# Patient Record
Sex: Female | Born: 1960 | Race: White | Hispanic: No | Smoking: Current every day smoker
Health system: Southern US, Community
[De-identification: ages and names within clinical notes are randomized; demographics above are authoritative.]

## PROBLEM LIST (undated history)

## (undated) DIAGNOSIS — M5136 Other intervertebral disc degeneration, lumbar region: Secondary | ICD-10-CM

## (undated) HISTORY — PX: DILATION AND CURETTAGE OF UTERUS: SHX78

## (undated) HISTORY — PX: CHOLECYSTECTOMY: SHX55

## (undated) HISTORY — PX: MASTECTOMY PARTIAL / LUMPECTOMY: SUR851

## (undated) HISTORY — DX: Other intervertebral disc degeneration, lumbar region: M51.36

---

## 2001-03-13 ENCOUNTER — Ambulatory Visit (HOSPITAL_COMMUNITY): Admission: RE | Admit: 2001-03-13 | Discharge: 2001-03-13 | Payer: Self-pay | Admitting: Obstetrics and Gynecology

## 2001-03-13 ENCOUNTER — Encounter: Payer: Self-pay | Admitting: Obstetrics and Gynecology

## 2006-01-26 ENCOUNTER — Ambulatory Visit (HOSPITAL_COMMUNITY): Admission: RE | Admit: 2006-01-26 | Discharge: 2006-01-26 | Payer: Self-pay | Admitting: Obstetrics and Gynecology

## 2008-06-23 ENCOUNTER — Ambulatory Visit (HOSPITAL_COMMUNITY): Admission: RE | Admit: 2008-06-23 | Discharge: 2008-06-23 | Payer: Self-pay | Admitting: Family Medicine

## 2008-07-10 ENCOUNTER — Ambulatory Visit (HOSPITAL_COMMUNITY): Admission: RE | Admit: 2008-07-10 | Discharge: 2008-07-10 | Payer: Self-pay | Admitting: Family Medicine

## 2008-07-10 ENCOUNTER — Encounter (INDEPENDENT_AMBULATORY_CARE_PROVIDER_SITE_OTHER): Payer: Self-pay | Admitting: Radiology

## 2008-07-17 ENCOUNTER — Encounter: Admission: RE | Admit: 2008-07-17 | Discharge: 2008-07-17 | Payer: Self-pay | Admitting: Family Medicine

## 2008-07-20 ENCOUNTER — Encounter: Admission: RE | Admit: 2008-07-20 | Discharge: 2008-07-20 | Payer: Self-pay | Admitting: Family Medicine

## 2008-07-31 ENCOUNTER — Ambulatory Visit (HOSPITAL_COMMUNITY): Admission: RE | Admit: 2008-07-31 | Discharge: 2008-07-31 | Payer: Self-pay | Admitting: General Surgery

## 2008-07-31 ENCOUNTER — Encounter (INDEPENDENT_AMBULATORY_CARE_PROVIDER_SITE_OTHER): Payer: Self-pay | Admitting: General Surgery

## 2008-09-03 ENCOUNTER — Encounter (HOSPITAL_COMMUNITY): Admission: RE | Admit: 2008-09-03 | Discharge: 2008-10-03 | Payer: Self-pay | Admitting: Oncology

## 2008-09-03 ENCOUNTER — Ambulatory Visit (HOSPITAL_COMMUNITY): Payer: Self-pay | Admitting: Oncology

## 2008-09-10 ENCOUNTER — Ambulatory Visit (HOSPITAL_COMMUNITY): Admission: RE | Admit: 2008-09-10 | Discharge: 2008-09-10 | Payer: Self-pay | Admitting: Oncology

## 2008-10-15 ENCOUNTER — Ambulatory Visit: Admission: RE | Admit: 2008-10-15 | Discharge: 2009-01-13 | Payer: Self-pay | Admitting: Radiation Oncology

## 2008-12-17 ENCOUNTER — Ambulatory Visit (HOSPITAL_COMMUNITY): Payer: Self-pay | Admitting: Oncology

## 2009-03-20 ENCOUNTER — Ambulatory Visit (HOSPITAL_COMMUNITY): Payer: Self-pay | Admitting: Oncology

## 2009-06-03 ENCOUNTER — Ambulatory Visit (HOSPITAL_COMMUNITY): Payer: Self-pay | Admitting: Oncology

## 2009-07-15 ENCOUNTER — Ambulatory Visit (HOSPITAL_COMMUNITY): Admission: RE | Admit: 2009-07-15 | Discharge: 2009-07-15 | Payer: Self-pay | Admitting: Family Medicine

## 2009-08-02 ENCOUNTER — Encounter: Payer: Self-pay | Admitting: Cardiology

## 2009-08-07 ENCOUNTER — Ambulatory Visit: Payer: Self-pay | Admitting: Cardiology

## 2010-03-20 ENCOUNTER — Encounter: Payer: Self-pay | Admitting: Obstetrics and Gynecology

## 2010-03-21 ENCOUNTER — Encounter: Payer: Self-pay | Admitting: Family Medicine

## 2010-06-07 LAB — COMPREHENSIVE METABOLIC PANEL
Albumin: 3.5 g/dL (ref 3.5–5.2)
Alkaline Phosphatase: 57 U/L (ref 39–117)
BUN: 11 mg/dL (ref 6–23)
CO2: 27 mEq/L (ref 19–32)
Chloride: 107 mEq/L (ref 96–112)
Creatinine, Ser: 0.74 mg/dL (ref 0.4–1.2)
GFR calc non Af Amer: >60 mL/min (ref >60)
Glucose, Bld: 106 mg/dL — ABNORMAL HIGH (ref 70–99)
Total Bilirubin: 0.7 mg/dL (ref 0.3–1.2)

## 2010-06-07 LAB — CBC
HCT: 38.2 % (ref 36.0–46.0)
Hemoglobin: 13.6 g/dL (ref 12.0–15.0)
MCV: 91.7 fL (ref 78.0–100.0)
Platelets: 151 10*3/uL (ref 150–400)
WBC: 7.3 10*3/uL (ref 4.0–10.5)

## 2010-06-22 ENCOUNTER — Other Ambulatory Visit (HOSPITAL_BASED_OUTPATIENT_CLINIC_OR_DEPARTMENT_OTHER): Payer: Self-pay | Admitting: Family Medicine

## 2010-06-22 DIAGNOSIS — C50919 Malignant neoplasm of unspecified site of unspecified female breast: Secondary | ICD-10-CM

## 2010-06-22 DIAGNOSIS — Z139 Encounter for screening, unspecified: Secondary | ICD-10-CM

## 2010-06-22 DIAGNOSIS — Z09 Encounter for follow-up examination after completed treatment for conditions other than malignant neoplasm: Secondary | ICD-10-CM

## 2010-07-13 NOTE — H&P (Signed)
Linda Douglas, CRESSEY NO.:  [id number]   MEDICAL RECORD NO.:  [id number]          PATIENT TYPE:  AMB   LOCATION:  DAY                           FACILITY:  [facility]   PHYSICIAN:  Dalia Heading, M.D.  DATE OF BIRTH:  March 02, 1960   DATE OF ADMISSION:  DATE OF DISCHARGE:  LH                              HISTORY & PHYSICAL   CHIEF COMPLAINT:  Right breast carcinoma.   HISTORY OF PRESENT ILLNESS:  The patient is a 50 year old white female  who is referred for evaluation and treatment of right breast cancer.  This was found on routine mammography and biopsy proven to be invasive  mammary carcinoma.  It is not palpable.  MRI is negative for axillary  involvement or cancer in the left breast.  No nipple discharge or family  history of breast carcinoma is noted.   PAST MEDICAL HISTORY:  Unremarkable.   PAST SURGICAL HISTORY:  Cholecystectomy.   CURRENT MEDICATIONS:  Xanax.   ALLERGIES:  PENICILLIN and CODEINE.   REVIEW OF SYSTEMS:  The patient does smoke.  She denies any alcohol use.  The patient denies any other cardiopulmonary difficulties or bleeding  disorders.   PHYSICAL EXAMINATION:  GENERAL:  The patient is a obese white female in  no acute distress.  NECK:  Supple without lymphadenopathy.  LUNGS:  Clear to auscultation with equal breath sounds bilaterally.  HEART:  Regular rate and rhythm without S3, S4, murmurs.  BREAST:  Right breast examination reveals no dominant mass, nipple  discharge, or dimpling.  The axilla is negative for palpable nodes.  Left breast examination reveals no dominant mass, nipple discharge, or  dimpling.  The axilla is negative for palpable nodes.   Mammography and  MRI reports were reviewed.   IMPRESSION:  Right breast carcinoma.   PLAN:  The patient is scheduled for right partial mastectomy after  needle localization with sentinel lymph node biopsy and possible  axillary dissection on July 30, 2008.  The risks and benefits of  the  procedure including bleeding, infection, arm swelling, the possibility  of needing further surgery were fully explained to the patient, gave  informed consent.      Dalia Heading, M.D.  Electronically Signed     MAJ/MEDQ  D:  07/22/2008  T:  07/23/2008  Job:  [id number]   cc:   Angus G. Renard Matter, MD  Fax: [phone]

## 2010-07-13 NOTE — Op Note (Signed)
NAMERELLA, EGELSTON NO.:  [id number]   MEDICAL RECORD NO.:  [id number]          PATIENT TYPE:  AMB   LOCATION:  DAY                           FACILITY:  [facility]   PHYSICIAN:  Dalia Heading, M.D.  DATE OF BIRTH:  1961-02-24   DATE OF PROCEDURE:  07/31/2008  DATE OF DISCHARGE:  07/31/2008                               OPERATIVE REPORT   PREOPERATIVE DIAGNOSIS:  Right breast carcinoma, stage I.   POSTOPERATIVE DIAGNOSIS:  Right breast carcinoma, stage I.   PROCEDURE:  Right partial mastectomy after needle localization, sentinel  lymph node biopsy, injection of methylene blue.   SURGEON:  Dalia Heading, MD   ANESTHESIA:  General endotracheal.   INDICATIONS:  The patient is a 50 year old white female, who was found  on routine mammography to have an invasive mammary carcinoma of the  right breast.  Preoperative MRI confirmed this and there was no axillary  involvement or involvement of the left breast.  The patient now comes to  the operating for right partial mastectomy after needle localization,  sentinel lymph node biopsy, and possible axillary dissection.  The risks  and benefits of the procedures including bleeding, infection,  cardiopulmonary difficulties, and pain were fully explained to the  patient, gave informed consent.   PROCEDURE NOTE:  The patient was placed in the supine position.  After  induction of general endotracheal anesthesia, the right breast at the  needle localization site was instilled with 4 mL of diluted methylene  blue.  It was messaged for 5 minutes.  The right breast and axilla were  prepped and draped using the usual sterile technique with Betadine.   Surgical site confirmation was performed.  An incision was made in the  right axilla.  This was done after a Neoprobe was used to isolate the  involved sentinel nodes.  The dissection was taken down to the lymph  nodes.  The in vivo count was 37, 100 and ex vivo count was 2700.   This  lymph node was blue.  Additional lymph node was found.  It was also  blue.  Both were removed and sent to Pathology for frozen section.  Frozen section was negative for malignancy.  The basin count was less  than 10.  The wound was irrigated with normal saline.  A bleeding was  controlled using small clips.  The subcutaneous layer was reapproximated  using a 3-0 Vicryl interrupted suture.  The skin was closed using a 4-0  Vicryl subcuticular suture.  Dermabond was then applied.   The right partial mastectomy after needle localization was then  performed.  The needle was in the outer, lower quadrant of the right  breast.  Using the needle guidance as well as the mammograms, the  remaining biopsy clip as well as the needle and a normal amount of  breast tissue was removed circumferentially from the right breast  without difficulty.  The specimen was then sent to Radiology.  Specimen  radiography revealed the suspicious lesion, clipped, and the needle to  be in the appropriate position.  Abdomen was  packed up.  A short suture  was placed superiorly and a long suture placed laterally for orientation  purposes.  The specimen was then sent to Pathology for further  examination.  Any bleeding was controlled using Bovie electrocautery.  The subcutaneous layer was reapproximated using a 3-0 Vicryl interrupted  suture.  The skin was closed using a 4-0 Vicryl subcuticular suture.  Dermabond was then applied.   All tape and needle counts correct at the end of the procedures.  The  patient was extubated in the operating room, went back to recovery room  awake in stable condition.   COMPLICATIONS:  None.   SPECIMEN:  Right breast tissue, sentinel lymph nodes, right axilla x2.   BLOOD LOSS:  Minimal.      Dalia Heading, M.D.  Electronically Signed     MAJ/MEDQ  D:  07/31/2008  T:  08/01/2008  Job:  [id number]   cc:   Angus G. Renard Matter, MD  Fax: [phone]

## 2010-07-21 ENCOUNTER — Ambulatory Visit (HOSPITAL_COMMUNITY)
Admission: RE | Admit: 2010-07-21 | Discharge: 2010-07-21 | Disposition: A | Discharge status: Home / Self Care | Payer: BC Managed Care – PPO | Source: Ambulatory Visit | Attending: Family Medicine | Admitting: Family Medicine

## 2010-07-21 DIAGNOSIS — C50919 Malignant neoplasm of unspecified site of unspecified female breast: Secondary | ICD-10-CM

## 2010-07-21 DIAGNOSIS — Z853 Personal history of malignant neoplasm of breast: Secondary | ICD-10-CM | POA: Insufficient documentation

## 2010-07-21 DIAGNOSIS — Z09 Encounter for follow-up examination after completed treatment for conditions other than malignant neoplasm: Secondary | ICD-10-CM

## 2011-05-04 IMAGING — MG MM BREAST NEEDLE LOCALIZATION*R*
4 series · 6 of 6 positions shown · non-contrast
Comparison: none

CLINICAL DATA: Newly-diagnosed right lateral breast invasive
mammary carcinoma.  Preoperative needle localization was performed.

[Series 1: R CC · right · 0.07mm/px · 2 of 2 slices shown (1 of 3)]
[im 1/2]
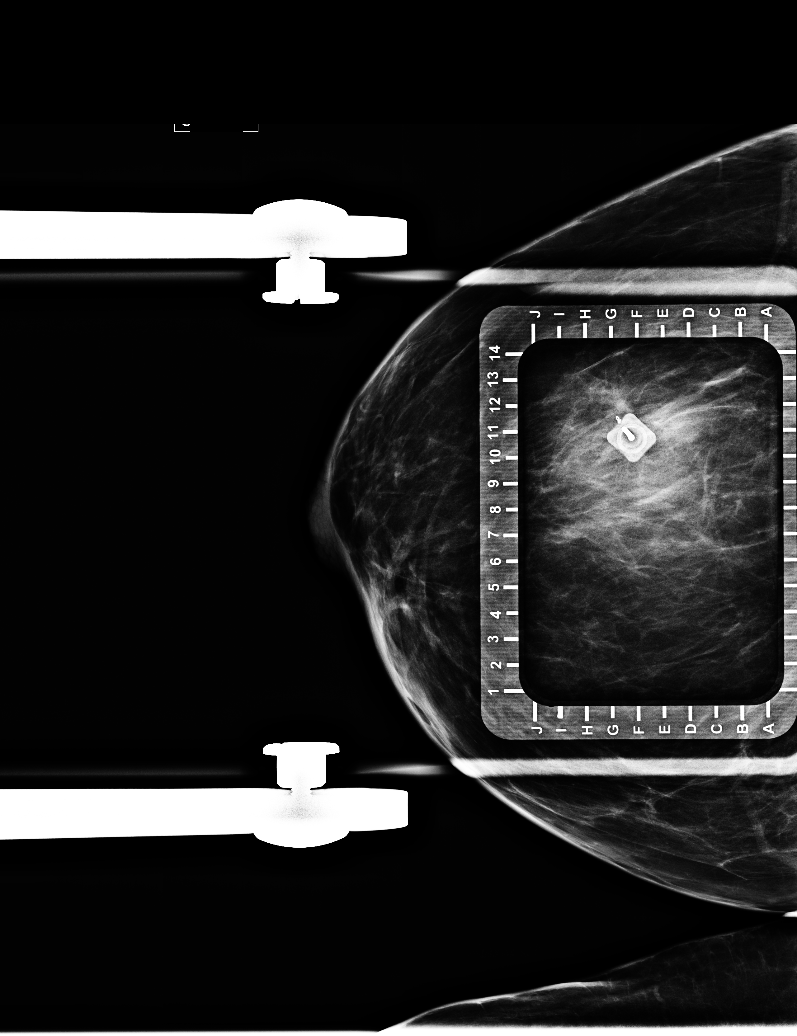
[im 2/2]
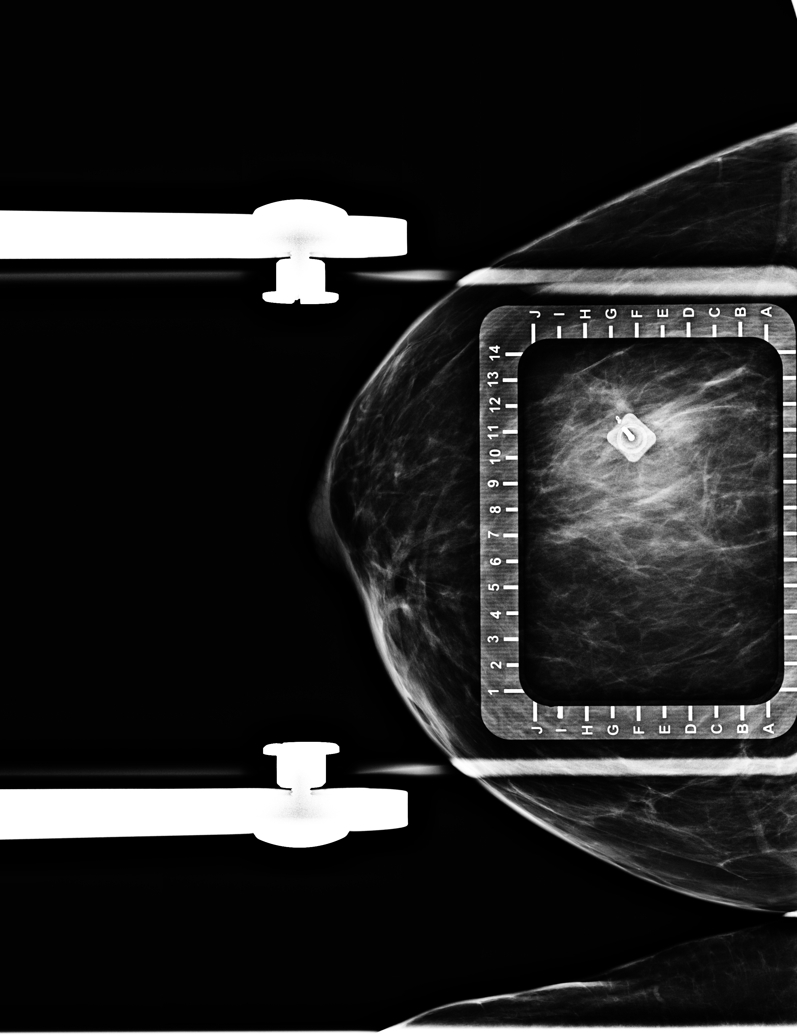

[Series 4: R LM · right · 0.07mm/px · 2 of 2 slices shown]
[im 1/2]
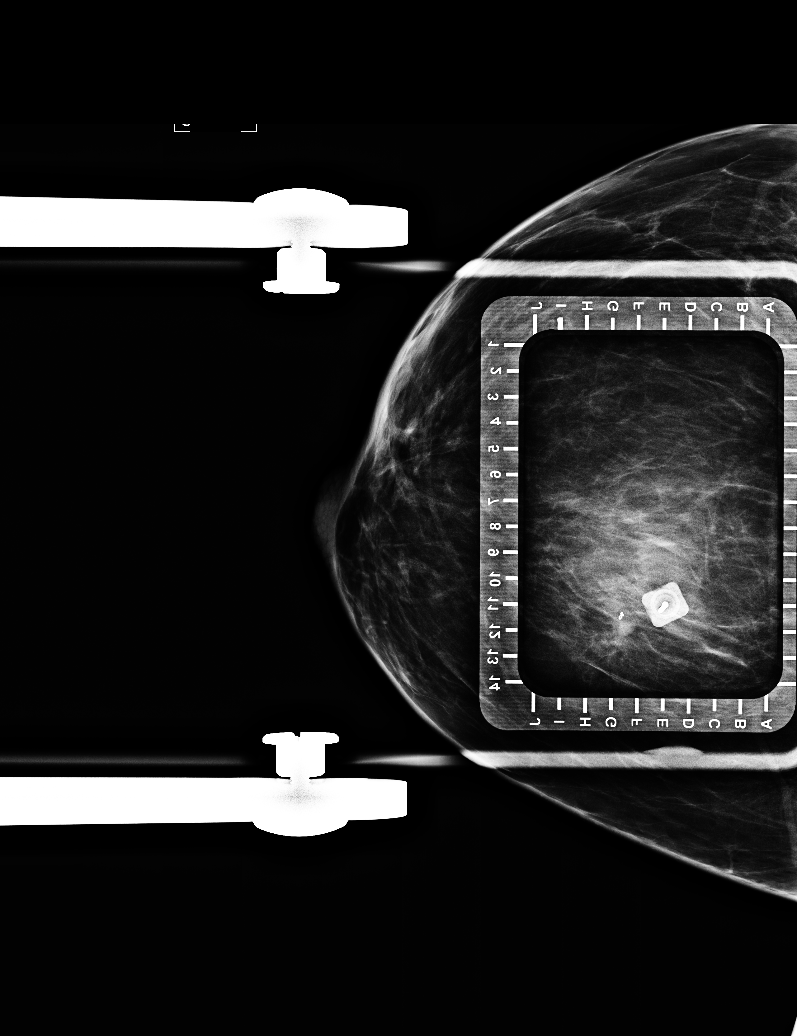
[im 2/2]
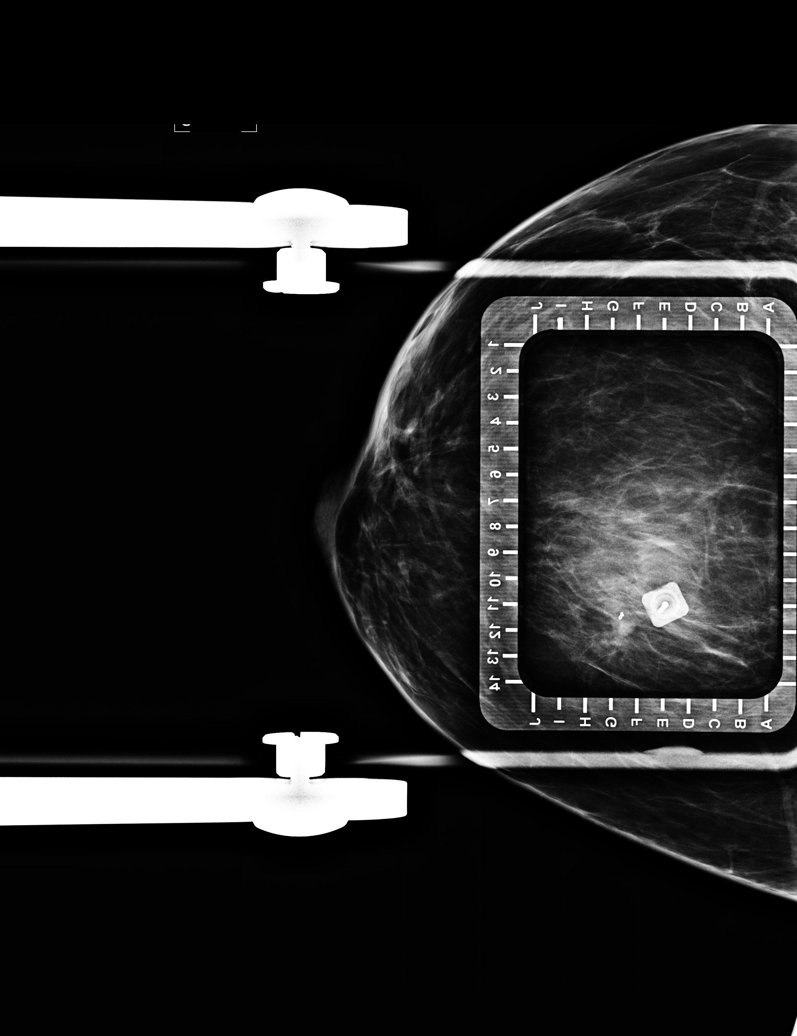

[R CC (2 of 3)]
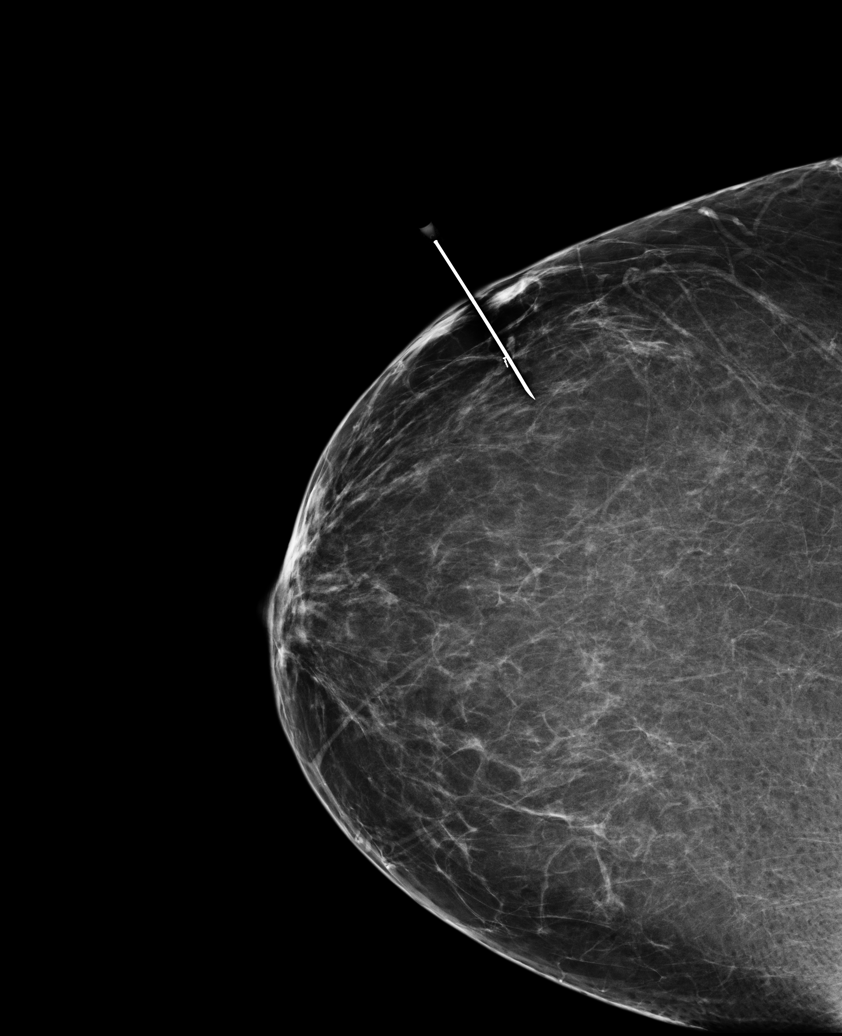

[R CC (3 of 3)]
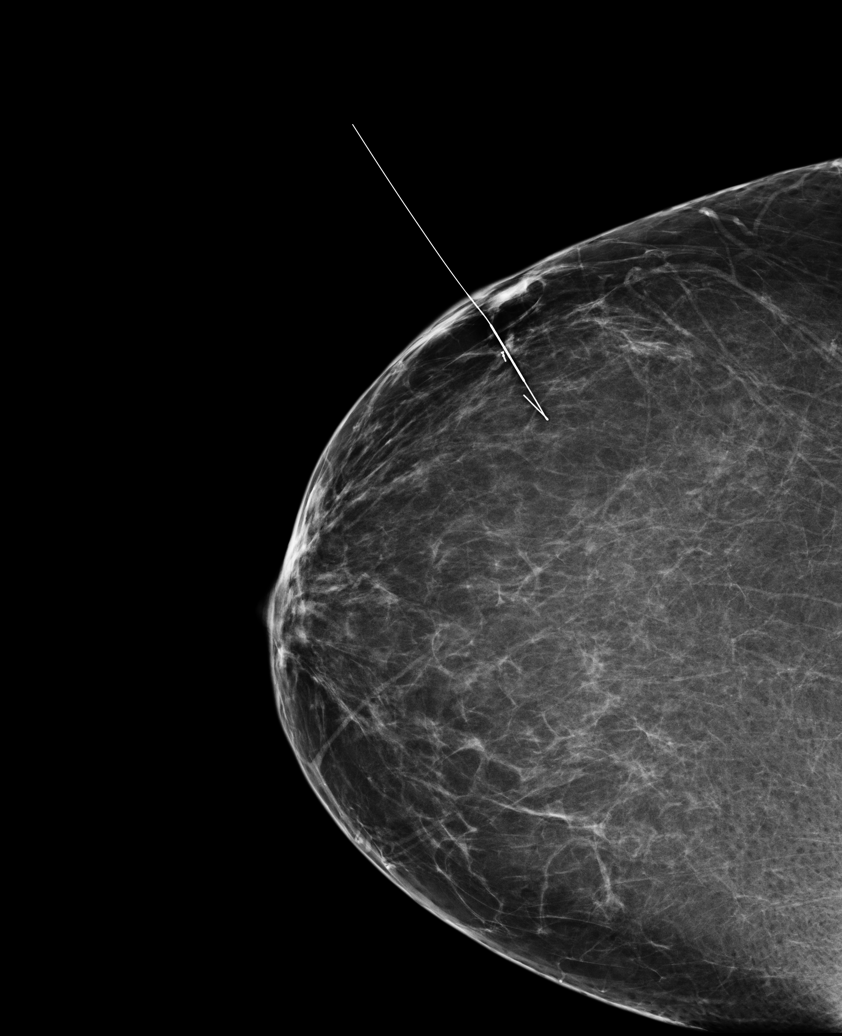

[6 of 6 positions shown; findings below may reference images not displayed]

NEEDLE LOCALIZATION WITH MAMMOGRAPHIC GUIDANCE AND SPECIMEN
RADIOGRAPH

Patient presents for needle localization prior to surgical excision
of biopsy-proven right lateral breast cancer.  I met with the
patient and we discussed the procedure of needle localization
including risks, benefits, and alternatives. Specifically, we
discussed the risks of infection, bleeding, tissue injury, and
inadequate sampling. Informed written consent was given.

Using mammographic guidance, sterile technique, 2% lidocaine and a
5 cm modified Kopans needle, the mass and clip were localized using
a lateral to medial approach.  The films are marked for Dr.
Apikan.

Specimen radiograph confirms the mass and clip are present in the
tissue sample. The specimen is marked for pathology.
IMPRESSION: Needle localization of clip and mass in the lateral right breast
and specimen radiography as above.  No apparent complications.

## 2011-08-17 ENCOUNTER — Other Ambulatory Visit (HOSPITAL_COMMUNITY): Payer: Self-pay | Admitting: Oncology

## 2011-08-17 DIAGNOSIS — Z09 Encounter for follow-up examination after completed treatment for conditions other than malignant neoplasm: Secondary | ICD-10-CM

## 2011-08-17 DIAGNOSIS — Z139 Encounter for screening, unspecified: Secondary | ICD-10-CM

## 2011-08-31 ENCOUNTER — Ambulatory Visit (HOSPITAL_COMMUNITY)
Admission: RE | Admit: 2011-08-31 | Discharge: 2011-08-31 | Disposition: A | Discharge status: Home / Self Care | Payer: BC Managed Care – PPO | Source: Ambulatory Visit | Attending: Oncology | Admitting: Oncology

## 2011-08-31 DIAGNOSIS — Z1231 Encounter for screening mammogram for malignant neoplasm of breast: Secondary | ICD-10-CM | POA: Insufficient documentation

## 2011-08-31 DIAGNOSIS — Z853 Personal history of malignant neoplasm of breast: Secondary | ICD-10-CM | POA: Insufficient documentation

## 2011-08-31 DIAGNOSIS — Z09 Encounter for follow-up examination after completed treatment for conditions other than malignant neoplasm: Secondary | ICD-10-CM

## 2012-03-25 ENCOUNTER — Encounter: Payer: Self-pay | Admitting: Cardiology

## 2013-01-07 ENCOUNTER — Other Ambulatory Visit (HOSPITAL_COMMUNITY): Payer: Self-pay | Admitting: Nurse Practitioner

## 2013-01-07 DIAGNOSIS — Z9889 Other specified postprocedural states: Secondary | ICD-10-CM

## 2013-01-23 ENCOUNTER — Encounter (HOSPITAL_COMMUNITY): Payer: BC Managed Care – PPO

## 2013-01-23 ENCOUNTER — Ambulatory Visit (HOSPITAL_COMMUNITY): Payer: BC Managed Care – PPO

## 2013-01-30 ENCOUNTER — Inpatient Hospital Stay (HOSPITAL_COMMUNITY): Admission: RE | Admit: 2013-01-30 | Payer: BC Managed Care – PPO | Source: Ambulatory Visit

## 2013-04-27 ENCOUNTER — Encounter: Payer: Self-pay | Admitting: Cardiology

## 2013-12-17 ENCOUNTER — Encounter: Payer: Self-pay | Admitting: Cardiology

## 2014-01-21 ENCOUNTER — Encounter: Payer: Self-pay | Admitting: *Deleted

## 2014-01-22 ENCOUNTER — Encounter: Payer: Self-pay | Admitting: Cardiology

## 2014-01-22 ENCOUNTER — Encounter: Payer: Self-pay | Admitting: *Deleted

## 2014-01-22 ENCOUNTER — Ambulatory Visit (INDEPENDENT_AMBULATORY_CARE_PROVIDER_SITE_OTHER): Payer: Worker's Compensation | Admitting: Cardiology

## 2014-01-22 DIAGNOSIS — R072 Precordial pain: Secondary | ICD-10-CM

## 2014-01-22 DIAGNOSIS — M549 Dorsalgia, unspecified: Secondary | ICD-10-CM

## 2014-01-22 DIAGNOSIS — G8929 Other chronic pain: Secondary | ICD-10-CM | POA: Insufficient documentation

## 2014-01-22 NOTE — Assessment & Plan Note (Signed)
Patient reports intermittent symptoms over the last few years, no escalating pattern. She has no clearly documented history of CAD, had an exercise echocardiogram done back in 2011 as detailed above. She is being considered for a Functional Capacity Evaluation through [facility] related to her chronic back pain. She states that she has actually already undergone some of this, however has not pursued any of the exercise testing. As noted above, we will plan a modified Bruce protocol GXT for more objective functional evaluation. She declines any type of pharmacologic stress test. Hopefully this information will be useful, even if she is not able to achieve adequate heart rate response.

## 2014-01-22 NOTE — Progress Notes (Signed)
Reason for visit: Cardiac evaluation  Clinical Summary Linda Douglas is a 53 y.o.female referred for cardiology consultation by Linda Douglas from [facility] on [address]. Situation is somewhat convoluted, records indicate that she was actually referred to see Linda Douglas for cardiology evaluation, however missed 3 visits and he refused to see her. My understanding is that she is being considered for a Functional Capacity Evaluation through [facility] which includes a variety of exercises including a submaximal treadmill evaluation and tasks requiring 55-85% age-adjusted maximum heart rate. She is being managed for chronic back pain reportedly in the setting of a work-related injury in June 2013 based on records from [facility].  I could not locate any clearly documented history of CAD. ECG done recently in October showed sinus rhythm with Q-wave in lead III, possibly normal variant, borderline low voltage in the precordial leads. Exercise echocardiogram done at [facility] back in June 2011 showed equivocal ST segment abnormalities in the setting of chest pain and poor exercise capacity, however no echocardiographic evidence of ischemia.  She tells me that she has had episodes of chest pain that happened intermittently when she "over does it" or sometimes when she "smokes too much." She is currently out of work, states that she has gained a significant amount of weight. Still does her chores around the house and other ADLs.  I discussed arranging a follow-up stress test with her today for objective cardiac evaluation, and also to get a good sense of her functional capacity since this might help gauge what she is able to do in rehabilitation/functional assessment. She tells me quite frankly that she is "scared of medications" and is not willing to undergo any type of pharmacologic stress test. I'm not certain how well she will do on a GXT in light of her reported back  and knee pain, however she is willing to give this a try.   Allergies  Allergen Reactions  . Codeine   . Penicillins     Current Outpatient Prescriptions  Medication Sig Dispense Refill  . ibuprofen (ADVIL,MOTRIN) 800 MG tablet Take 800 mg by mouth every 8 (eight) hours as needed.     No current facility-administered medications for this visit.    Past Medical History  Diagnosis Date  . Lumbar degenerative disc disease     Past Surgical History  Procedure Laterality Date  . Cholecystectomy    . Dilation and curettage of uterus    . Mastectomy partial / lumpectomy Right     Family History  Problem Relation Age of Onset  . Stroke Father   . Diabetes Mother   . Hypertension Mother   . Hypertension Brother   . Hypertension Sister   . Cerebrovascular Accident Father   . Congestive Heart Failure Maternal Grandmother   . Heart disease Maternal Grandmother   . Heart disease Maternal Grandfather     Social History Linda Douglas reports that she has been smoking Cigarettes.  She started smoking about 38 years ago. She has been smoking about 0.50 packs per day. She does not have any smokeless tobacco history on file. Linda Douglas reports that she does not drink alcohol.  Review of Systems Complete review of systems negative except as otherwise outlined in the clinical summary and also the following. No palpitations or syncope. No definite claudication. NYHA class II dyspnea.  Physical Examination Filed Vitals:   01/22/14 1025  BP: 132/83  Pulse: 71   Filed Weights   01/22/14 1025  Weight: 267 lb (121.11 kg)   Morbidly obese woman, appears comfortable at rest. HEENT: Conjunctiva and lids normal, oropharynx clear. Neck: Supple, increased girth, difficult to assess JVP, no carotid bruits, no thyromegaly. Lungs: Clear to auscultation, nonlabored breathing at rest. Cardiac: Regular rate and rhythm, no S3 or significant systolic murmur. Abdomen: Protuberant, bowel sounds  present, no guarding or rebound. Extremities: No pitting edema, distal pulses 2+. Skin: Warm and dry. Musculoskeletal: No kyphosis. Neuropsychiatric: Alert and oriented x3, affect grossly appropriate.   Problem List and Plan   Precordial pain Patient reports intermittent symptoms over the last few years, no escalating pattern. She has no clearly documented history of CAD, had an exercise echocardiogram done back in 2011 as detailed above. She is being considered for a Functional Capacity Evaluation through [facility] related to her chronic back pain. She states that she has actually already undergone some of this, however has not pursued any of the exercise testing. As noted above, we will plan a modified Bruce protocol GXT for more objective functional evaluation. She declines any type of pharmacologic stress test. Hopefully this information will be useful, even if she is not able to achieve adequate heart rate response.  Chronic back pain Followed by [facility], reportedly secondary to work-related injury in 2013.    Linda Douglas, M.D., F.A.C.C.

## 2014-01-22 NOTE — Assessment & Plan Note (Signed)
Followed by [facility], reportedly secondary to work-related injury in 2013.

## 2014-01-22 NOTE — Patient Instructions (Signed)
Your physician has requested that you have an exercise tolerance test. For further information please visit [email or web address]. Please also follow instruction sheet, as given. We will contact you with your results.

## 2014-02-03 ENCOUNTER — Ambulatory Visit (HOSPITAL_COMMUNITY)
Admission: RE | Admit: 2014-02-03 | Discharge: 2014-02-03 | Disposition: A | Discharge status: Home / Self Care | Payer: Worker's Compensation | Source: Ambulatory Visit | Attending: Cardiology | Admitting: Cardiology

## 2014-02-03 ENCOUNTER — Encounter (HOSPITAL_COMMUNITY): Payer: Self-pay

## 2014-02-03 DIAGNOSIS — M25561 Pain in right knee: Secondary | ICD-10-CM | POA: Insufficient documentation

## 2014-02-03 DIAGNOSIS — M549 Dorsalgia, unspecified: Secondary | ICD-10-CM | POA: Diagnosis not present

## 2014-02-03 DIAGNOSIS — Z0000 Encounter for general adult medical examination without abnormal findings: Secondary | ICD-10-CM | POA: Diagnosis not present

## 2014-02-03 DIAGNOSIS — Z538 Procedure and treatment not carried out for other reasons: Secondary | ICD-10-CM | POA: Diagnosis not present

## 2014-02-03 DIAGNOSIS — R079 Chest pain, unspecified: Secondary | ICD-10-CM

## 2014-02-03 DIAGNOSIS — M25551 Pain in right hip: Secondary | ICD-10-CM | POA: Diagnosis not present

## 2014-02-03 NOTE — Progress Notes (Addendum)
Stress Lab Nurses Notes - Linda Douglas  Linda Douglas 02/03/2014 Reason for doing test: Physical Type of test: Regular GTX Nurse performing test: Felicity Coyer, RN Nuclear Medicine Tech: Not Applicable Echo Tech: Not Applicable MD performing test: Dr. Lynann Beaver. Purcell Nails, NP Family MD: Dr. Everette Rank Test explained and consent signed: Yes.   IV started: No IV started Symptoms: right hip and right knee pain Treatment/Intervention: None Reason test stopped: right hip and right knee pain. After recovery IV was: No IV started Patient to return to [place]. Med at : Patient discharged: Home Patient's Condition upon discharge was: stable Comments: During test unable to obtain BP due to pt stopping prior to checking, HR 125.  In recovery BP 126/88 & HR 85.  Symptoms resolved during recovery.  [name]   Patient did not tolerate walking on the treadmill due to back pain during warm up period. She was not able to initiate Bruce protocol. Test aborted. Baseline EKG shows normal sinus rhythm. Unable to evaluate for ischemia or exercise induced ischemia. Consider pharmacologic testing if indicated.   Zandra Abts MD

## 2014-02-04 ENCOUNTER — Telehealth: Payer: Self-pay

## 2014-02-04 NOTE — Telephone Encounter (Signed)
  Message    GXT results reviewed. Test was aborted due to patient inability to ambulate on the treadmill adequately. No information was therefore able to be obtained regarding functional capacity or ischemic workup. Patient stated at her office visit that she was not willing to perform a pharmacologic stress test. Please let her know that this test did not provide any information that would allow [place] to further comment on her ability to tolerate rehabilitation through her orthopedic surgeon.      ----- Message -----    From: Arnoldo Lenis, MD    Sent: 02/03/2014  1:02 PM     To: Satira Sark, MD   Subject: Inpatient Notes                        Patient notified of results,still declines pharmacologic stress test,will forward to pcp

## 2014-02-27 ENCOUNTER — Ambulatory Visit (INDEPENDENT_AMBULATORY_CARE_PROVIDER_SITE_OTHER): Payer: BC Managed Care – PPO | Admitting: Obstetrics and Gynecology

## 2014-02-27 ENCOUNTER — Encounter: Payer: Self-pay | Admitting: Obstetrics and Gynecology

## 2014-02-27 ENCOUNTER — Other Ambulatory Visit: Payer: Self-pay | Admitting: Obstetrics and Gynecology

## 2014-02-27 ENCOUNTER — Other Ambulatory Visit (HOSPITAL_COMMUNITY)
Admission: RE | Admit: 2014-02-27 | Discharge: 2014-02-27 | Disposition: A | Discharge status: Home / Self Care | Payer: BC Managed Care – PPO | Source: Ambulatory Visit | Attending: Obstetrics and Gynecology | Admitting: Obstetrics and Gynecology

## 2014-02-27 VITALS — BP 112/78 | Ht 62.0 in | Wt 266.0 lb

## 2014-02-27 DIAGNOSIS — Z01411 Encounter for gynecological examination (general) (routine) with abnormal findings: Secondary | ICD-10-CM | POA: Diagnosis not present

## 2014-02-27 DIAGNOSIS — Z1151 Encounter for screening for human papillomavirus (HPV): Secondary | ICD-10-CM | POA: Insufficient documentation

## 2014-02-27 DIAGNOSIS — R87810 Cervical high risk human papillomavirus (HPV) DNA test positive: Secondary | ICD-10-CM | POA: Diagnosis present

## 2014-02-27 DIAGNOSIS — R87619 Unspecified abnormal cytological findings in specimens from cervix uteri: Secondary | ICD-10-CM

## 2014-02-27 NOTE — Progress Notes (Signed)
Patient ID: Linda Douglas, female   DOB: [birth year], 53 y.o.   MRN: [id number] Pt here today for abnormal pap smear done in October of 2014. Pt is unsure of what the results were she just knows they were abnormal.  Linda Douglas 53 y.o. No obstetric history on file. here for colposcopy for abnormal unknown severity pap smear on 11/2012.  Discussed role for HPV in cervical dysplasia, need for surveillance.  Patient given informed consent, signed copy in the chart, time out was performed.  Placed in lithotomy position. Cervix viewed with speculum and colposcope after application of acetic acid.   Colposcopy adequate? Yes  no abnormal vasculature; biopsies obtained at random at 3, 6. 9.   ECC specimen obtained.yes All specimens were labelled and sent to pathology.  Colposcopy IMPRESSION:noi visible dysplasia  Patient was given post procedure instructions. Will follow up pathology and manage accordingly.  Routine preventative health maintenance measures emphasized.

## 2014-03-04 LAB — CYTOLOGY - PAP

## 2014-03-06 ENCOUNTER — Ambulatory Visit (INDEPENDENT_AMBULATORY_CARE_PROVIDER_SITE_OTHER): Payer: BC Managed Care – PPO | Admitting: Obstetrics and Gynecology

## 2014-03-06 ENCOUNTER — Ambulatory Visit: Payer: BC Managed Care – PPO | Admitting: Obstetrics and Gynecology

## 2014-03-06 ENCOUNTER — Encounter: Payer: Self-pay | Admitting: Obstetrics and Gynecology

## 2014-03-06 VITALS — BP 120/80 | Ht 62.0 in | Wt 265.0 lb

## 2014-03-06 DIAGNOSIS — R87610 Atypical squamous cells of undetermined significance on cytologic smear of cervix (ASC-US): Secondary | ICD-10-CM

## 2014-03-06 NOTE — Progress Notes (Signed)
Patient ID: Linda Douglas, female   DOB: Jun 15, 1960, 54 y.o.   MRN: [id number] Pt here today for results of biopsy and pap smear.   [facility] Visit  Patient name: Linda Douglas MRN [id number]  Date of birth: 11/22/1960  CC & HPI:  Linda Douglas is a 54 y.o. female presenting today for results Cx bx and ecc.   ROS:  Pt amenorrheic x 10+ yr.  Pertinent History Reviewed:   Reviewed: Significant for cigarette smoking reduced by grandchild living in house. Medical         Past Medical History  Diagnosis Date  . Lumbar degenerative disc disease                               Surgical Hx:    Past Surgical History  Procedure Laterality Date  . Cholecystectomy    . Dilation and curettage of uterus    . Mastectomy partial / lumpectomy Right    Medications: Reviewed & Updated - see associated section                      Current outpatient prescriptions: ibuprofen (ADVIL,MOTRIN) 800 MG tablet, Take 800 mg by mouth every 8 (eight) hours as needed., Disp: , Rfl:    Social History: Reviewed -  reports that she has been smoking Cigarettes.  She started smoking about 38 years ago. She has been smoking about 0.50 packs per day. She has never used smokeless tobacco.  Objective Findings:  Vitals: Blood pressure 120/80, height 5\' 2"  (1.575 m), weight 265 lb (120.203 kg).  Physical Examination: General appearance - alert, well appearing, and in no distress, oriented to person, place, and time and normal appearing weight Mental status - alert, oriented to person, place, and time   Assessment & Plan:   A:  1. Koilocytic atypia, negative ECC,  P:  1. Pap in 1 yr.

## 2016-04-19 ENCOUNTER — Other Ambulatory Visit (HOSPITAL_COMMUNITY): Payer: Self-pay | Admitting: *Deleted

## 2016-04-19 DIAGNOSIS — Z9889 Other specified postprocedural states: Secondary | ICD-10-CM

## 2016-05-10 ENCOUNTER — Encounter (HOSPITAL_COMMUNITY): Payer: Self-pay

## 2016-05-23 ENCOUNTER — Other Ambulatory Visit: Payer: Self-pay | Admitting: Family Medicine

## 2016-05-24 ENCOUNTER — Ambulatory Visit (HOSPITAL_COMMUNITY)
Admission: RE | Admit: 2016-05-24 | Discharge: 2016-05-24 | Disposition: A | Discharge status: Home / Self Care | Payer: PRIVATE HEALTH INSURANCE | Source: Ambulatory Visit | Attending: *Deleted | Admitting: *Deleted

## 2016-05-24 DIAGNOSIS — Z853 Personal history of malignant neoplasm of breast: Secondary | ICD-10-CM | POA: Diagnosis not present

## 2016-05-24 DIAGNOSIS — Z9889 Other specified postprocedural states: Secondary | ICD-10-CM

## 2019-12-18 ENCOUNTER — Other Ambulatory Visit (HOSPITAL_COMMUNITY): Payer: Self-pay | Admitting: General Practice

## 2019-12-18 DIAGNOSIS — Z1231 Encounter for screening mammogram for malignant neoplasm of breast: Secondary | ICD-10-CM

## 2020-01-31 ENCOUNTER — Ambulatory Visit (HOSPITAL_COMMUNITY): Payer: PRIVATE HEALTH INSURANCE

## 2020-02-05 ENCOUNTER — Ambulatory Visit (HOSPITAL_COMMUNITY): Payer: Self-pay

## 2020-04-03 ENCOUNTER — Other Ambulatory Visit (HOSPITAL_COMMUNITY): Payer: Self-pay | Admitting: Sports Medicine

## 2020-04-03 DIAGNOSIS — Z1239 Encounter for other screening for malignant neoplasm of breast: Secondary | ICD-10-CM

## 2020-04-13 ENCOUNTER — Ambulatory Visit (HOSPITAL_COMMUNITY)
Admission: RE | Admit: 2020-04-13 | Discharge: 2020-04-13 | Disposition: A | Discharge status: Home / Self Care | Payer: Self-pay | Source: Ambulatory Visit | Attending: Sports Medicine | Admitting: Sports Medicine

## 2020-04-13 ENCOUNTER — Other Ambulatory Visit: Payer: Self-pay

## 2020-04-13 ENCOUNTER — Encounter (HOSPITAL_COMMUNITY): Payer: Self-pay

## 2020-04-13 DIAGNOSIS — Z1239 Encounter for other screening for malignant neoplasm of breast: Secondary | ICD-10-CM

## 2020-04-13 DIAGNOSIS — Z1231 Encounter for screening mammogram for malignant neoplasm of breast: Secondary | ICD-10-CM | POA: Insufficient documentation

## 2022-03-22 DIAGNOSIS — S90852A Superficial foreign body, left foot, initial encounter: Secondary | ICD-10-CM | POA: Diagnosis not present

## 2022-03-29 DIAGNOSIS — N938 Other specified abnormal uterine and vaginal bleeding: Secondary | ICD-10-CM | POA: Diagnosis not present

## 2022-03-29 DIAGNOSIS — Z1231 Encounter for screening mammogram for malignant neoplasm of breast: Secondary | ICD-10-CM | POA: Diagnosis not present

## 2022-03-30 ENCOUNTER — Other Ambulatory Visit (HOSPITAL_COMMUNITY): Payer: Self-pay | Admitting: Family Medicine

## 2022-03-30 DIAGNOSIS — Z1231 Encounter for screening mammogram for malignant neoplasm of breast: Secondary | ICD-10-CM

## 2022-04-21 DIAGNOSIS — N938 Other specified abnormal uterine and vaginal bleeding: Secondary | ICD-10-CM | POA: Diagnosis not present

## 2022-04-21 DIAGNOSIS — Z885 Allergy status to narcotic agent status: Secondary | ICD-10-CM | POA: Diagnosis not present

## 2022-04-21 DIAGNOSIS — E119 Type 2 diabetes mellitus without complications: Secondary | ICD-10-CM | POA: Diagnosis not present

## 2022-04-21 DIAGNOSIS — Z880 Allergy status to penicillin: Secondary | ICD-10-CM | POA: Diagnosis not present

## 2022-05-02 DIAGNOSIS — N950 Postmenopausal bleeding: Secondary | ICD-10-CM | POA: Diagnosis not present

## 2022-05-02 DIAGNOSIS — R9389 Abnormal findings on diagnostic imaging of other specified body structures: Secondary | ICD-10-CM | POA: Diagnosis not present

## 2022-05-02 DIAGNOSIS — N938 Other specified abnormal uterine and vaginal bleeding: Secondary | ICD-10-CM | POA: Diagnosis not present

## 2022-05-03 DIAGNOSIS — N950 Postmenopausal bleeding: Secondary | ICD-10-CM | POA: Diagnosis not present

## 2022-05-05 DIAGNOSIS — D239 Other benign neoplasm of skin, unspecified: Secondary | ICD-10-CM | POA: Diagnosis not present

## 2022-05-05 DIAGNOSIS — L723 Sebaceous cyst: Secondary | ICD-10-CM | POA: Diagnosis not present

## 2022-05-17 DIAGNOSIS — H5213 Myopia, bilateral: Secondary | ICD-10-CM | POA: Diagnosis not present

## 2022-05-17 DIAGNOSIS — H52223 Regular astigmatism, bilateral: Secondary | ICD-10-CM | POA: Diagnosis not present

## 2022-05-17 DIAGNOSIS — H524 Presbyopia: Secondary | ICD-10-CM | POA: Diagnosis not present

## 2022-06-09 DIAGNOSIS — L723 Sebaceous cyst: Secondary | ICD-10-CM | POA: Diagnosis not present

## 2022-06-15 DIAGNOSIS — G43909 Migraine, unspecified, not intractable, without status migrainosus: Secondary | ICD-10-CM | POA: Diagnosis not present

## 2022-06-15 DIAGNOSIS — R030 Elevated blood-pressure reading, without diagnosis of hypertension: Secondary | ICD-10-CM | POA: Diagnosis not present

## 2022-06-15 DIAGNOSIS — R69 Illness, unspecified: Secondary | ICD-10-CM | POA: Diagnosis not present

## 2022-06-15 DIAGNOSIS — Z6841 Body Mass Index (BMI) 40.0 and over, adult: Secondary | ICD-10-CM | POA: Diagnosis not present

## 2022-06-15 DIAGNOSIS — Z853 Personal history of malignant neoplasm of breast: Secondary | ICD-10-CM | POA: Diagnosis not present

## 2022-06-20 DIAGNOSIS — Z885 Allergy status to narcotic agent status: Secondary | ICD-10-CM | POA: Diagnosis not present

## 2022-06-20 DIAGNOSIS — Z8249 Family history of ischemic heart disease and other diseases of the circulatory system: Secondary | ICD-10-CM | POA: Diagnosis not present

## 2022-06-20 DIAGNOSIS — Z823 Family history of stroke: Secondary | ICD-10-CM | POA: Diagnosis not present

## 2022-06-20 DIAGNOSIS — Z6841 Body Mass Index (BMI) 40.0 and over, adult: Secondary | ICD-10-CM | POA: Diagnosis not present

## 2022-06-20 DIAGNOSIS — Z888 Allergy status to other drugs, medicaments and biological substances status: Secondary | ICD-10-CM | POA: Diagnosis not present

## 2022-06-20 DIAGNOSIS — Z853 Personal history of malignant neoplasm of breast: Secondary | ICD-10-CM | POA: Diagnosis not present

## 2022-06-20 DIAGNOSIS — Z87891 Personal history of nicotine dependence: Secondary | ICD-10-CM | POA: Diagnosis not present

## 2022-06-20 DIAGNOSIS — Z809 Family history of malignant neoplasm, unspecified: Secondary | ICD-10-CM | POA: Diagnosis not present

## 2022-06-20 DIAGNOSIS — Z008 Encounter for other general examination: Secondary | ICD-10-CM | POA: Diagnosis not present

## 2022-06-20 DIAGNOSIS — I10 Essential (primary) hypertension: Secondary | ICD-10-CM | POA: Diagnosis not present

## 2022-06-20 DIAGNOSIS — Z825 Family history of asthma and other chronic lower respiratory diseases: Secondary | ICD-10-CM | POA: Diagnosis not present

## 2022-06-20 DIAGNOSIS — Z791 Long term (current) use of non-steroidal anti-inflammatories (NSAID): Secondary | ICD-10-CM | POA: Diagnosis not present

## 2022-07-13 DIAGNOSIS — N644 Mastodynia: Secondary | ICD-10-CM | POA: Diagnosis not present

## 2022-07-13 DIAGNOSIS — Z853 Personal history of malignant neoplasm of breast: Secondary | ICD-10-CM | POA: Diagnosis not present

## 2022-09-12 DIAGNOSIS — J3489 Other specified disorders of nose and nasal sinuses: Secondary | ICD-10-CM | POA: Diagnosis not present

## 2022-09-12 DIAGNOSIS — K047 Periapical abscess without sinus: Secondary | ICD-10-CM | POA: Diagnosis not present

## 2022-09-12 DIAGNOSIS — R030 Elevated blood-pressure reading, without diagnosis of hypertension: Secondary | ICD-10-CM | POA: Diagnosis not present

## 2022-09-12 DIAGNOSIS — Z6841 Body Mass Index (BMI) 40.0 and over, adult: Secondary | ICD-10-CM | POA: Diagnosis not present

## 2022-09-20 DIAGNOSIS — E039 Hypothyroidism, unspecified: Secondary | ICD-10-CM | POA: Diagnosis not present

## 2022-09-20 DIAGNOSIS — Z0000 Encounter for general adult medical examination without abnormal findings: Secondary | ICD-10-CM | POA: Diagnosis not present

## 2022-09-20 DIAGNOSIS — Z13220 Encounter for screening for lipoid disorders: Secondary | ICD-10-CM | POA: Diagnosis not present

## 2022-09-20 DIAGNOSIS — E7849 Other hyperlipidemia: Secondary | ICD-10-CM | POA: Diagnosis not present

## 2022-09-20 DIAGNOSIS — Z1329 Encounter for screening for other suspected endocrine disorder: Secondary | ICD-10-CM | POA: Diagnosis not present

## 2022-09-21 DIAGNOSIS — R7309 Other abnormal glucose: Secondary | ICD-10-CM | POA: Diagnosis not present

## 2022-09-23 DIAGNOSIS — Z853 Personal history of malignant neoplasm of breast: Secondary | ICD-10-CM | POA: Diagnosis not present

## 2022-09-23 DIAGNOSIS — Z0001 Encounter for general adult medical examination with abnormal findings: Secondary | ICD-10-CM | POA: Diagnosis not present

## 2022-09-23 DIAGNOSIS — R030 Elevated blood-pressure reading, without diagnosis of hypertension: Secondary | ICD-10-CM | POA: Diagnosis not present

## 2022-09-23 DIAGNOSIS — F411 Generalized anxiety disorder: Secondary | ICD-10-CM | POA: Diagnosis not present

## 2022-09-23 DIAGNOSIS — Z6841 Body Mass Index (BMI) 40.0 and over, adult: Secondary | ICD-10-CM | POA: Diagnosis not present

## 2022-09-23 DIAGNOSIS — R739 Hyperglycemia, unspecified: Secondary | ICD-10-CM | POA: Diagnosis not present

## 2022-10-11 DIAGNOSIS — L304 Erythema intertrigo: Secondary | ICD-10-CM | POA: Diagnosis not present

## 2022-10-11 DIAGNOSIS — R1013 Epigastric pain: Secondary | ICD-10-CM | POA: Diagnosis not present

## 2022-12-13 DIAGNOSIS — M810 Age-related osteoporosis without current pathological fracture: Secondary | ICD-10-CM | POA: Diagnosis not present

## 2023-05-23 DIAGNOSIS — Z880 Allergy status to penicillin: Secondary | ICD-10-CM | POA: Diagnosis not present

## 2023-05-23 DIAGNOSIS — M792 Neuralgia and neuritis, unspecified: Secondary | ICD-10-CM | POA: Diagnosis not present

## 2023-05-23 DIAGNOSIS — Z87891 Personal history of nicotine dependence: Secondary | ICD-10-CM | POA: Diagnosis not present

## 2023-05-23 DIAGNOSIS — M1990 Unspecified osteoarthritis, unspecified site: Secondary | ICD-10-CM | POA: Diagnosis not present

## 2023-05-23 DIAGNOSIS — Z833 Family history of diabetes mellitus: Secondary | ICD-10-CM | POA: Diagnosis not present

## 2023-05-23 DIAGNOSIS — F32A Depression, unspecified: Secondary | ICD-10-CM | POA: Diagnosis not present

## 2023-05-23 DIAGNOSIS — Z823 Family history of stroke: Secondary | ICD-10-CM | POA: Diagnosis not present

## 2023-05-23 DIAGNOSIS — M810 Age-related osteoporosis without current pathological fracture: Secondary | ICD-10-CM | POA: Diagnosis not present

## 2023-05-23 DIAGNOSIS — Z8249 Family history of ischemic heart disease and other diseases of the circulatory system: Secondary | ICD-10-CM | POA: Diagnosis not present

## 2023-05-23 DIAGNOSIS — Z885 Allergy status to narcotic agent status: Secondary | ICD-10-CM | POA: Diagnosis not present

## 2023-05-23 DIAGNOSIS — Z809 Family history of malignant neoplasm, unspecified: Secondary | ICD-10-CM | POA: Diagnosis not present

## 2023-06-14 DIAGNOSIS — H524 Presbyopia: Secondary | ICD-10-CM | POA: Diagnosis not present

## 2023-06-14 DIAGNOSIS — H52223 Regular astigmatism, bilateral: Secondary | ICD-10-CM | POA: Diagnosis not present

## 2023-06-14 DIAGNOSIS — H5213 Myopia, bilateral: Secondary | ICD-10-CM | POA: Diagnosis not present

## 2023-06-19 ENCOUNTER — Encounter (HOSPITAL_BASED_OUTPATIENT_CLINIC_OR_DEPARTMENT_OTHER): Payer: Self-pay | Admitting: Internal Medicine

## 2023-06-19 DIAGNOSIS — R5383 Other fatigue: Secondary | ICD-10-CM

## 2023-06-19 DIAGNOSIS — R0683 Snoring: Secondary | ICD-10-CM

## 2023-06-19 DIAGNOSIS — R0681 Apnea, not elsewhere classified: Secondary | ICD-10-CM

## 2023-07-11 DIAGNOSIS — Z6841 Body Mass Index (BMI) 40.0 and over, adult: Secondary | ICD-10-CM | POA: Diagnosis not present

## 2023-07-11 DIAGNOSIS — L729 Follicular cyst of the skin and subcutaneous tissue, unspecified: Secondary | ICD-10-CM | POA: Diagnosis not present

## 2023-07-11 DIAGNOSIS — N760 Acute vaginitis: Secondary | ICD-10-CM | POA: Diagnosis not present

## 2023-07-27 DIAGNOSIS — M79602 Pain in left arm: Secondary | ICD-10-CM | POA: Diagnosis not present

## 2023-07-27 DIAGNOSIS — Z6841 Body Mass Index (BMI) 40.0 and over, adult: Secondary | ICD-10-CM | POA: Diagnosis not present

## 2023-07-27 DIAGNOSIS — M542 Cervicalgia: Secondary | ICD-10-CM | POA: Diagnosis not present

## 2023-07-29 DIAGNOSIS — Z6841 Body Mass Index (BMI) 40.0 and over, adult: Secondary | ICD-10-CM | POA: Diagnosis not present

## 2023-07-29 DIAGNOSIS — M542 Cervicalgia: Secondary | ICD-10-CM | POA: Diagnosis not present

## 2023-07-29 DIAGNOSIS — M79602 Pain in left arm: Secondary | ICD-10-CM | POA: Diagnosis not present

## 2023-08-02 DIAGNOSIS — M79602 Pain in left arm: Secondary | ICD-10-CM | POA: Diagnosis not present

## 2023-08-02 DIAGNOSIS — M542 Cervicalgia: Secondary | ICD-10-CM | POA: Diagnosis not present

## 2023-08-14 DIAGNOSIS — M6281 Muscle weakness (generalized): Secondary | ICD-10-CM | POA: Diagnosis not present

## 2023-08-14 DIAGNOSIS — M542 Cervicalgia: Secondary | ICD-10-CM | POA: Diagnosis not present

## 2023-08-14 DIAGNOSIS — M79602 Pain in left arm: Secondary | ICD-10-CM | POA: Diagnosis not present

## 2023-08-18 DIAGNOSIS — M25511 Pain in right shoulder: Secondary | ICD-10-CM | POA: Diagnosis not present

## 2023-08-18 DIAGNOSIS — M25512 Pain in left shoulder: Secondary | ICD-10-CM | POA: Diagnosis not present

## 2023-08-18 DIAGNOSIS — M542 Cervicalgia: Secondary | ICD-10-CM | POA: Diagnosis not present

## 2023-08-18 DIAGNOSIS — M79602 Pain in left arm: Secondary | ICD-10-CM | POA: Diagnosis not present

## 2023-08-21 DIAGNOSIS — M79602 Pain in left arm: Secondary | ICD-10-CM | POA: Diagnosis not present

## 2023-08-21 DIAGNOSIS — M25512 Pain in left shoulder: Secondary | ICD-10-CM | POA: Diagnosis not present

## 2023-08-21 DIAGNOSIS — M25511 Pain in right shoulder: Secondary | ICD-10-CM | POA: Diagnosis not present

## 2023-08-21 DIAGNOSIS — M542 Cervicalgia: Secondary | ICD-10-CM | POA: Diagnosis not present

## 2023-08-28 DIAGNOSIS — M79602 Pain in left arm: Secondary | ICD-10-CM | POA: Diagnosis not present

## 2023-08-28 DIAGNOSIS — M542 Cervicalgia: Secondary | ICD-10-CM | POA: Diagnosis not present

## 2023-08-28 DIAGNOSIS — M25512 Pain in left shoulder: Secondary | ICD-10-CM | POA: Diagnosis not present

## 2023-08-28 DIAGNOSIS — M25511 Pain in right shoulder: Secondary | ICD-10-CM | POA: Diagnosis not present

## 2023-08-30 DIAGNOSIS — M25512 Pain in left shoulder: Secondary | ICD-10-CM | POA: Diagnosis not present

## 2023-08-30 DIAGNOSIS — M25511 Pain in right shoulder: Secondary | ICD-10-CM | POA: Diagnosis not present

## 2023-08-30 DIAGNOSIS — M542 Cervicalgia: Secondary | ICD-10-CM | POA: Diagnosis not present

## 2023-08-30 DIAGNOSIS — M79602 Pain in left arm: Secondary | ICD-10-CM | POA: Diagnosis not present

## 2023-09-04 DIAGNOSIS — M25512 Pain in left shoulder: Secondary | ICD-10-CM | POA: Diagnosis not present

## 2023-09-04 DIAGNOSIS — M25511 Pain in right shoulder: Secondary | ICD-10-CM | POA: Diagnosis not present

## 2023-09-04 DIAGNOSIS — M79602 Pain in left arm: Secondary | ICD-10-CM | POA: Diagnosis not present

## 2023-09-04 DIAGNOSIS — M542 Cervicalgia: Secondary | ICD-10-CM | POA: Diagnosis not present

## 2023-09-06 DIAGNOSIS — M542 Cervicalgia: Secondary | ICD-10-CM | POA: Diagnosis not present

## 2023-09-06 DIAGNOSIS — M25512 Pain in left shoulder: Secondary | ICD-10-CM | POA: Diagnosis not present

## 2023-09-06 DIAGNOSIS — M25511 Pain in right shoulder: Secondary | ICD-10-CM | POA: Diagnosis not present

## 2023-09-06 DIAGNOSIS — M79602 Pain in left arm: Secondary | ICD-10-CM | POA: Diagnosis not present

## 2023-09-18 DIAGNOSIS — M79602 Pain in left arm: Secondary | ICD-10-CM | POA: Diagnosis not present

## 2023-09-18 DIAGNOSIS — M542 Cervicalgia: Secondary | ICD-10-CM | POA: Diagnosis not present

## 2023-09-18 DIAGNOSIS — M25511 Pain in right shoulder: Secondary | ICD-10-CM | POA: Diagnosis not present

## 2023-09-18 DIAGNOSIS — M25512 Pain in left shoulder: Secondary | ICD-10-CM | POA: Diagnosis not present

## 2023-10-03 NOTE — Procedures (Signed)
 Orders only

## 2023-11-03 DIAGNOSIS — M545 Low back pain, unspecified: Secondary | ICD-10-CM | POA: Diagnosis not present

## 2023-11-03 DIAGNOSIS — F411 Generalized anxiety disorder: Secondary | ICD-10-CM | POA: Diagnosis not present

## 2024-01-31 DIAGNOSIS — M545 Low back pain, unspecified: Secondary | ICD-10-CM | POA: Diagnosis not present

## 2024-01-31 DIAGNOSIS — M25512 Pain in left shoulder: Secondary | ICD-10-CM | POA: Diagnosis not present

## 2024-01-31 DIAGNOSIS — M25511 Pain in right shoulder: Secondary | ICD-10-CM | POA: Diagnosis not present

## 2024-01-31 DIAGNOSIS — M542 Cervicalgia: Secondary | ICD-10-CM | POA: Diagnosis not present

## 2024-02-07 DIAGNOSIS — M25511 Pain in right shoulder: Secondary | ICD-10-CM | POA: Diagnosis not present

## 2024-02-07 DIAGNOSIS — M25512 Pain in left shoulder: Secondary | ICD-10-CM | POA: Diagnosis not present

## 2024-02-07 DIAGNOSIS — M545 Low back pain, unspecified: Secondary | ICD-10-CM | POA: Diagnosis not present

## 2024-02-07 DIAGNOSIS — M542 Cervicalgia: Secondary | ICD-10-CM | POA: Diagnosis not present

## 2024-02-12 DIAGNOSIS — M25511 Pain in right shoulder: Secondary | ICD-10-CM | POA: Diagnosis not present

## 2024-02-12 DIAGNOSIS — M25512 Pain in left shoulder: Secondary | ICD-10-CM | POA: Diagnosis not present

## 2024-02-12 DIAGNOSIS — M545 Low back pain, unspecified: Secondary | ICD-10-CM | POA: Diagnosis not present

## 2024-02-12 DIAGNOSIS — M542 Cervicalgia: Secondary | ICD-10-CM | POA: Diagnosis not present

## 2024-02-19 DIAGNOSIS — M545 Low back pain, unspecified: Secondary | ICD-10-CM | POA: Diagnosis not present

## 2024-02-19 DIAGNOSIS — M25512 Pain in left shoulder: Secondary | ICD-10-CM | POA: Diagnosis not present

## 2024-02-19 DIAGNOSIS — M25511 Pain in right shoulder: Secondary | ICD-10-CM | POA: Diagnosis not present

## 2024-02-19 DIAGNOSIS — M542 Cervicalgia: Secondary | ICD-10-CM | POA: Diagnosis not present
